# Patient Record
Sex: Female | Born: 1975 | Race: White | Hispanic: No | Marital: Married | Smoking: Former smoker
Health system: Southern US, Community
[De-identification: ages and names within clinical notes are randomized; demographics above are authoritative.]

## PROBLEM LIST (undated history)

## (undated) DIAGNOSIS — E559 Vitamin D deficiency, unspecified: Secondary | ICD-10-CM

## (undated) DIAGNOSIS — K5909 Other constipation: Secondary | ICD-10-CM

## (undated) DIAGNOSIS — R102 Pelvic and perineal pain: Secondary | ICD-10-CM

## (undated) DIAGNOSIS — K219 Gastro-esophageal reflux disease without esophagitis: Secondary | ICD-10-CM

## (undated) DIAGNOSIS — Z8742 Personal history of other diseases of the female genital tract: Secondary | ICD-10-CM

## (undated) HISTORY — PX: LAPAROSCOPIC ASSISTED VAGINAL HYSTERECTOMY: SHX5398

---

## 2004-07-30 ENCOUNTER — Other Ambulatory Visit: Admission: RE | Admit: 2004-07-30 | Discharge: 2004-07-30 | Payer: Self-pay | Admitting: Obstetrics and Gynecology

## 2004-12-30 ENCOUNTER — Inpatient Hospital Stay (HOSPITAL_COMMUNITY): Admission: AD | Admit: 2004-12-30 | Discharge: 2004-12-31 | Payer: Self-pay | Admitting: Obstetrics and Gynecology

## 2005-02-12 ENCOUNTER — Inpatient Hospital Stay (HOSPITAL_COMMUNITY): Admission: AD | Admit: 2005-02-12 | Discharge: 2005-02-14 | Payer: Self-pay | Admitting: Obstetrics and Gynecology

## 2005-08-01 ENCOUNTER — Other Ambulatory Visit: Admission: RE | Admit: 2005-08-01 | Discharge: 2005-08-01 | Payer: Self-pay | Admitting: Obstetrics and Gynecology

## 2007-10-06 HISTORY — PX: LAPAROSCOPIC TUBAL LIGATION: SUR803

## 2007-10-15 ENCOUNTER — Ambulatory Visit: Payer: Self-pay | Admitting: Obstetrics and Gynecology

## 2007-10-23 ENCOUNTER — Ambulatory Visit: Payer: Self-pay | Admitting: Obstetrics and Gynecology

## 2009-02-21 ENCOUNTER — Ambulatory Visit (HOSPITAL_COMMUNITY): Admission: RE | Admit: 2009-02-21 | Discharge: 2009-02-22 | Payer: Self-pay | Admitting: Obstetrics and Gynecology

## 2009-02-21 ENCOUNTER — Encounter (INDEPENDENT_AMBULATORY_CARE_PROVIDER_SITE_OTHER): Payer: Self-pay | Admitting: Obstetrics and Gynecology

## 2010-04-22 LAB — CBC
Hemoglobin: 10.9 g/dL — ABNORMAL LOW (ref 12.0–15.0)
MCV: 93.3 fL (ref 78.0–100.0)
Platelets: 147 10*3/uL — ABNORMAL LOW (ref 150–400)
Platelets: 191 10*3/uL (ref 150–400)
RDW: 12.8 % (ref 11.5–15.5)
RDW: 12.9 % (ref 11.5–15.5)
WBC: 5.9 10*3/uL (ref 4.0–10.5)

## 2010-04-22 LAB — PREGNANCY, URINE: Preg Test, Ur: NEGATIVE

## 2010-06-22 NOTE — Discharge Summary (Signed)
NAMEJIMMI, [name]                ACCOUNT NO.:  [id number]   MEDICAL RECORD NO.:  [id number]          PATIENT TYPE:  INP   LOCATION:  9124                          FACILITY:  [facility]   PHYSICIAN:  Zenaida Niece, M.D.DATE OF BIRTH:  1975/06/23   DATE OF ADMISSION:  02/12/2005  DATE OF DISCHARGE:                                 DISCHARGE SUMMARY   ADMISSION DIAGNOSES:  1.  Intrauterine pregnancy at 39 weeks.  2.  Group B streptococcus carrier.   DISCHARGE DIAGNOSES:  1.  Intrauterine pregnancy at 39 weeks.  2.  Group B streptococcus carrier.   PROCEDURES:  On February 12, 2005, she had a spontaneous vaginal delivery.   HISTORY AND PHYSICAL:  This is a 35 year old white female gravida 2 para 0-0-  1-0 with an EGA of [redacted] weeks who presents for elective induction. Prenatal  care essentially uncomplicated. Prenatal labs:  Blood type is A positive  with a negative antibody screen, RPR nonreactive, rubella equivocal,  hepatitis B surface antigen negative, HIV negative, gonorrhea and chlamydia  negative, triple screen normal. One-hour Glucola 162; 3-hour GTT 74, 149,  141, and 120. Group B strep is positive. Past OB history:  One elective  termination. Past GYN history:  She had a LEEP in 2000. Allergies to CODEINE  which causes nausea and vomiting. Physical exam:  She is afebrile with  stable vital signs with blood pressures 120-140 over 80-90. Fetal heart  tracing is reactive. Abdomen gravid, nontender with an estimated fetal  weight of 6 pounds. Cervix is 3, 80, -1, vertex presentation, adequate  pelvis, and amniotomy reveals clear fluid.   HOSPITAL COURSE:  The patient was admitted and started on Pitocin for  induction and penicillin for group B strep prophylaxis. On my first exam I  then also performed amniotomy for augmentation. She progressed into active  labor and received an epidural. She then progressed to complete, pushed  well, and on the afternoon of January 9 had a vaginal  delivery of a viable  female infant with Apgars of 9 and 9 that weighed 6 pounds 11 ounces. A loose  nuchal cord x2 was reduced. Placenta delivered spontaneous, was intact, and  was sent for cord blood collection. She had a small first-degree laceration  repaired with 3-0 Vicryl and estimated blood loss was less than 500 cc.  Postpartum she had no significant complications. Predelivery hemoglobin 11,  postdelivery 9.8. On postpartum day #2 she was felt to be stable enough for  discharge home.   DISCHARGE INSTRUCTIONS:  Regular diet, pelvic rest, follow-up in 6 weeks.  Medications are over-the-counter ibuprofen as needed, and she is given our  discharge pamphlet.      Zenaida Niece, M.D.  Electronically Signed    TDM/MEDQ  D:  02/14/2005  T:  02/14/2005  Job:  [id number]

## 2010-11-29 ENCOUNTER — Ambulatory Visit: Payer: Self-pay | Admitting: Family Medicine

## 2014-02-09 ENCOUNTER — Ambulatory Visit: Payer: Self-pay | Admitting: Gastroenterology

## 2014-02-14 ENCOUNTER — Ambulatory Visit: Payer: Self-pay | Admitting: Gastroenterology

## 2014-05-30 LAB — SURGICAL PATHOLOGY

## 2015-10-27 ENCOUNTER — Encounter (HOSPITAL_BASED_OUTPATIENT_CLINIC_OR_DEPARTMENT_OTHER): Payer: Self-pay | Admitting: *Deleted

## 2015-10-27 NOTE — Progress Notes (Signed)
NPO AFTER MN.  ARRIVE AT 0600.  GETTING LAB WORK DONE Monday 10-30-2015 (CBC, BMET).

## 2015-10-30 DIAGNOSIS — N736 Female pelvic peritoneal adhesions (postinfective): Secondary | ICD-10-CM | POA: Diagnosis not present

## 2015-10-30 DIAGNOSIS — E559 Vitamin D deficiency, unspecified: Secondary | ICD-10-CM | POA: Diagnosis not present

## 2015-10-30 DIAGNOSIS — K219 Gastro-esophageal reflux disease without esophagitis: Secondary | ICD-10-CM | POA: Diagnosis not present

## 2015-10-30 DIAGNOSIS — K5909 Other constipation: Secondary | ICD-10-CM | POA: Diagnosis not present

## 2015-10-30 DIAGNOSIS — R102 Pelvic and perineal pain: Secondary | ICD-10-CM | POA: Diagnosis not present

## 2015-10-30 DIAGNOSIS — Z90710 Acquired absence of both cervix and uterus: Secondary | ICD-10-CM | POA: Diagnosis not present

## 2015-10-30 DIAGNOSIS — Z87891 Personal history of nicotine dependence: Secondary | ICD-10-CM | POA: Diagnosis not present

## 2015-10-30 LAB — BASIC METABOLIC PANEL
Anion gap: 8 (ref 5–15)
BUN: 10 mg/dL (ref 6–20)
CALCIUM: 9.3 mg/dL (ref 8.9–10.3)
CO2: 29 mmol/L (ref 22–32)
Chloride: 104 mmol/L (ref 101–111)
Creatinine, Ser: 0.6 mg/dL (ref 0.44–1.00)
GFR calc Af Amer: >60 mL/min (ref >60)
GLUCOSE: 79 mg/dL (ref 65–99)
POTASSIUM: 3.6 mmol/L (ref 3.5–5.1)
Sodium: 141 mmol/L (ref 135–145)

## 2015-10-30 LAB — CBC
HCT: 37.2 % (ref 36.0–46.0)
HEMOGLOBIN: 12.7 g/dL (ref 12.0–15.0)
MCH: 30.2 pg (ref 26.0–34.0)
MCHC: 34.1 g/dL (ref 30.0–36.0)
MCV: 88.6 fL (ref 78.0–100.0)
PLATELETS: 215 10*3/uL (ref 150–400)
RBC: 4.2 MIL/uL (ref 3.87–5.11)
RDW: 12.3 % (ref 11.5–15.5)
WBC: 5.6 10*3/uL (ref 4.0–10.5)

## 2015-10-30 NOTE — H&P (Signed)
Carolyn LuzSonya Renea Bowers is an 40 y.o. female.   She hHad LAVH/BSO in 2011 for pelvic pain/endometriosis, doing well. Still has occasional pain, concerned. Has some night sweats, tolerable. Sexually active, using Replens 2-3x/week for dryness and this has helped. Had normal colonoscopy and GI eval. Getting mammogram today.  Pain is getting worse, she now desires laparoscopy.  Pertinent Gynecological History: Last mammogram: normal Date: 07/2015 OB History: G2, P1011   Menstrual History: No LMP recorded. Patient has had a hysterectomy.    Past Medical History:  Diagnosis Date  . Chronic constipation   . GERD (gastroesophageal reflux disease)   . History of endometriosis   . Pelvic pain in female   . Vitamin D deficiency     Past Surgical History:  Procedure Laterality Date  . LAPAROSCOPIC ASSISTED VAGINAL HYSTERECTOMY  02-21-2009  dr Jackelyn Knifemeisinger   w/ Bilateral Salpinoophorectomy  . LAPAROSCOPIC TUBAL LIGATION Bilateral 10/2007   w/ Ablation Endometriosis    History reviewed. No pertinent family history.  Social History:  reports that she quit smoking about 6 years ago. Her smoking use included Cigarettes. She quit after 15.00 years of use. She has never used smokeless tobacco. She reports that she does not drink alcohol or use drugs.  Allergies: No Known Allergies  No prescriptions prior to admission.    Review of Systems  Respiratory: Negative.   Cardiovascular: Negative.     Height 5\' 3"  (1.6 m), weight 60.8 kg (134 lb). Physical Exam  Constitutional: She appears well-developed and well-nourished.  Neck: Neck supple. No thyromegaly present.  Cardiovascular: Normal rate, regular rhythm and normal heart sounds.   No murmur heard. Respiratory: Effort normal and breath sounds normal. No respiratory distress. She has no wheezes.  GI: Soft. She exhibits no distension and no mass. There is no tenderness.  Genitourinary: Vagina normal.  Genitourinary Comments: No adnexal mass or  significant tenderness    Results for orders placed or performed during the hospital encounter of 10/31/15 (from the past 24 hour(s))  CBC     Status: None   Collection Time: 10/30/15 11:20 AM  Result Value Ref Range   WBC 5.6 4.0 - 10.5 K/uL   RBC 4.20 3.87 - 5.11 MIL/uL   Hemoglobin 12.7 12.0 - 15.0 g/dL   HCT 16.137.2 09.636.0 - 04.546.0 %   MCV 88.6 78.0 - 100.0 fL   MCH 30.2 26.0 - 34.0 pg   MCHC 34.1 30.0 - 36.0 g/dL   RDW 40.912.3 81.111.5 - 91.415.5 %   Platelets 215 150 - 400 K/uL  Basic metabolic panel     Status: None   Collection Time: 10/30/15 11:20 AM  Result Value Ref Range   Sodium 141 135 - 145 mmol/L   Potassium 3.6 3.5 - 5.1 mmol/L   Chloride 104 101 - 111 mmol/L   CO2 29 22 - 32 mmol/L   Glucose, Bld 79 65 - 99 mg/dL   BUN 10 6 - 20 mg/dL   Creatinine, Ser 7.820.60 0.44 - 1.00 mg/dL   Calcium 9.3 8.9 - 95.610.3 mg/dL   GFR calc non Af Amer >60 >60 mL/min   GFR calc Af Amer >60 >60 mL/min   Anion gap 8 5 - 15    No results found.  Assessment/Plan: Persistent, recurrent pelvic pain s/p LAVH 6 years ago.  Discussed all medical and surgical options.  Discussed laparoscopic procedure, risks, chances of relieving symptoms.  Will admit for diagnostic/possible operative laparoscopy.  [name] D 10/30/2015, 8:23 PM

## 2015-10-31 ENCOUNTER — Ambulatory Visit (HOSPITAL_BASED_OUTPATIENT_CLINIC_OR_DEPARTMENT_OTHER): Payer: BLUE CROSS/BLUE SHIELD | Admitting: Anesthesiology

## 2015-10-31 ENCOUNTER — Encounter (HOSPITAL_BASED_OUTPATIENT_CLINIC_OR_DEPARTMENT_OTHER)
Admission: RE | Disposition: A | Discharge status: Home / Self Care | Payer: Self-pay | Source: Ambulatory Visit | Attending: Obstetrics and Gynecology

## 2015-10-31 ENCOUNTER — Ambulatory Visit (HOSPITAL_BASED_OUTPATIENT_CLINIC_OR_DEPARTMENT_OTHER)
Admission: RE | Admit: 2015-10-31 | Discharge: 2015-10-31 | Disposition: A | Discharge status: Home / Self Care | Payer: BLUE CROSS/BLUE SHIELD | Source: Ambulatory Visit | Attending: Obstetrics and Gynecology | Admitting: Obstetrics and Gynecology

## 2015-10-31 ENCOUNTER — Encounter (HOSPITAL_BASED_OUTPATIENT_CLINIC_OR_DEPARTMENT_OTHER): Payer: Self-pay | Admitting: *Deleted

## 2015-10-31 DIAGNOSIS — K5909 Other constipation: Secondary | ICD-10-CM | POA: Insufficient documentation

## 2015-10-31 DIAGNOSIS — R102 Pelvic and perineal pain: Secondary | ICD-10-CM | POA: Diagnosis not present

## 2015-10-31 DIAGNOSIS — E559 Vitamin D deficiency, unspecified: Secondary | ICD-10-CM | POA: Insufficient documentation

## 2015-10-31 DIAGNOSIS — N736 Female pelvic peritoneal adhesions (postinfective): Secondary | ICD-10-CM | POA: Insufficient documentation

## 2015-10-31 DIAGNOSIS — K219 Gastro-esophageal reflux disease without esophagitis: Secondary | ICD-10-CM | POA: Insufficient documentation

## 2015-10-31 DIAGNOSIS — Z90710 Acquired absence of both cervix and uterus: Secondary | ICD-10-CM | POA: Insufficient documentation

## 2015-10-31 DIAGNOSIS — Z87891 Personal history of nicotine dependence: Secondary | ICD-10-CM | POA: Insufficient documentation

## 2015-10-31 HISTORY — DX: Vitamin D deficiency, unspecified: E55.9

## 2015-10-31 HISTORY — DX: Pelvic and perineal pain: R10.2

## 2015-10-31 HISTORY — PX: LAPAROSCOPY: SHX197

## 2015-10-31 HISTORY — DX: Personal history of other diseases of the female genital tract: Z87.42

## 2015-10-31 HISTORY — DX: Gastro-esophageal reflux disease without esophagitis: K21.9

## 2015-10-31 HISTORY — DX: Other constipation: K59.09

## 2015-10-31 SURGERY — LAPAROSCOPY, DIAGNOSTIC
Anesthesia: General | Site: Abdomen

## 2015-10-31 MED ORDER — PROPOFOL 10 MG/ML IV BOLUS
INTRAVENOUS | Status: AC
  Filled 2015-10-31: qty 20

## 2015-10-31 MED ORDER — HYDROCODONE-ACETAMINOPHEN 5-325 MG PO TABS: 1.0000 | ORAL_TABLET | Freq: Four times a day (QID) | ORAL | 0 refills | Status: AC | PRN

## 2015-10-31 MED ORDER — MIDAZOLAM HCL 2 MG/2ML IJ SOLN
INTRAMUSCULAR | Status: AC
  Filled 2015-10-31: qty 2

## 2015-10-31 MED ORDER — HYDROCODONE-ACETAMINOPHEN 5-325 MG PO TABS
1.0000 | ORAL_TABLET | Freq: Once | ORAL | Status: AC
  Administered 2015-10-31: 1 via ORAL
  Filled 2015-10-31: qty 1

## 2015-10-31 MED ORDER — ONDANSETRON HCL 4 MG/2ML IJ SOLN
4.0000 mg | Freq: Once | INTRAMUSCULAR | Status: DC | PRN
  Filled 2015-10-31: qty 2

## 2015-10-31 MED ORDER — PROPOFOL 10 MG/ML IV BOLUS
INTRAVENOUS | Status: DC | PRN
  Administered 2015-10-31: 200 mg via INTRAVENOUS

## 2015-10-31 MED ORDER — ONDANSETRON HCL 4 MG/2ML IJ SOLN
INTRAMUSCULAR | Status: DC | PRN
  Administered 2015-10-31: 4 mg via INTRAVENOUS

## 2015-10-31 MED ORDER — LIDOCAINE HCL (CARDIAC) 20 MG/ML IV SOLN
INTRAVENOUS | Status: DC | PRN
  Administered 2015-10-31: 100 mg via INTRAVENOUS

## 2015-10-31 MED ORDER — DEXAMETHASONE SODIUM PHOSPHATE 10 MG/ML IJ SOLN
INTRAMUSCULAR | Status: AC
  Filled 2015-10-31: qty 1

## 2015-10-31 MED ORDER — FENTANYL CITRATE (PF) 100 MCG/2ML IJ SOLN
25.0000 ug | INTRAMUSCULAR | Status: DC | PRN
  Filled 2015-10-31: qty 1

## 2015-10-31 MED ORDER — KETOROLAC TROMETHAMINE 30 MG/ML IJ SOLN
INTRAMUSCULAR | Status: AC
  Filled 2015-10-31: qty 1

## 2015-10-31 MED ORDER — FENTANYL CITRATE (PF) 100 MCG/2ML IJ SOLN
INTRAMUSCULAR | Status: AC
  Filled 2015-10-31: qty 2

## 2015-10-31 MED ORDER — SODIUM CHLORIDE 0.9 % IR SOLN
Status: DC | PRN
  Administered 2015-10-31: 1000 mL

## 2015-10-31 MED ORDER — HYDROCODONE-ACETAMINOPHEN 5-325 MG PO TABS
ORAL_TABLET | ORAL | Status: AC
  Filled 2015-10-31: qty 1

## 2015-10-31 MED ORDER — SUCCINYLCHOLINE CHLORIDE 20 MG/ML IJ SOLN
INTRAMUSCULAR | Status: DC | PRN
  Administered 2015-10-31: 100 mg via INTRAVENOUS

## 2015-10-31 MED ORDER — LIDOCAINE 2% (20 MG/ML) 5 ML SYRINGE
INTRAMUSCULAR | Status: AC
  Filled 2015-10-31: qty 5

## 2015-10-31 MED ORDER — DEXAMETHASONE SODIUM PHOSPHATE 4 MG/ML IJ SOLN
INTRAMUSCULAR | Status: DC | PRN
  Administered 2015-10-31: 10 mg via INTRAVENOUS

## 2015-10-31 MED ORDER — BUPIVACAINE HCL (PF) 0.25 % IJ SOLN
INTRAMUSCULAR | Status: DC | PRN
  Administered 2015-10-31: 20 mL

## 2015-10-31 MED ORDER — ONDANSETRON HCL 4 MG/2ML IJ SOLN
INTRAMUSCULAR | Status: AC
  Filled 2015-10-31: qty 2

## 2015-10-31 MED ORDER — FENTANYL CITRATE (PF) 100 MCG/2ML IJ SOLN
INTRAMUSCULAR | Status: DC | PRN
  Administered 2015-10-31: 25 ug via INTRAVENOUS
  Administered 2015-10-31: 50 ug via INTRAVENOUS
  Administered 2015-10-31 (×3): 25 ug via INTRAVENOUS
  Administered 2015-10-31: 50 ug via INTRAVENOUS

## 2015-10-31 MED ORDER — LACTATED RINGERS IV SOLN
INTRAVENOUS | Status: DC
  Administered 2015-10-31 (×2): via INTRAVENOUS
  Filled 2015-10-31: qty 1000

## 2015-10-31 MED ORDER — KETOROLAC TROMETHAMINE 30 MG/ML IJ SOLN
INTRAMUSCULAR | Status: DC | PRN
  Administered 2015-10-31: 30 mg via INTRAVENOUS

## 2015-10-31 MED ORDER — MIDAZOLAM HCL 5 MG/5ML IJ SOLN
INTRAMUSCULAR | Status: DC | PRN
  Administered 2015-10-31: 2 mg via INTRAVENOUS

## 2015-10-31 SURGICAL SUPPLY — 54 items
APL SKNCLS STERI-STRIP NONHPOA (GAUZE/BANDAGES/DRESSINGS) ×1
APPLICATOR COTTON TIP 6IN STRL (MISCELLANEOUS) ×3 IMPLANT
BAG SPEC RTRVL LRG 6X4 10 (ENDOMECHANICALS)
BENZOIN TINCTURE PRP APPL 2/3 (GAUZE/BANDAGES/DRESSINGS) ×3 IMPLANT
BLADE SURG 15 STRL LF DISP TIS (BLADE) ×1 IMPLANT
BLADE SURG 15 STRL SS (BLADE) ×3
CANISTER SUCTION 1200CC (MISCELLANEOUS) IMPLANT
CANISTER SUCTION 2500CC (MISCELLANEOUS) ×2 IMPLANT
CATH ROBINSON RED A/P 16FR (CATHETERS) ×2 IMPLANT
CLOSURE WOUND 1/4X4 (GAUZE/BANDAGES/DRESSINGS)
DECANTER SPIKE VIAL GLASS SM (MISCELLANEOUS) IMPLANT
DRAPE UNDERBUTTOCKS STRL (DRAPE) ×3 IMPLANT
ELECT REM PT RETURN 9FT ADLT (ELECTROSURGICAL) ×3
ELECTRODE REM PT RTRN 9FT ADLT (ELECTROSURGICAL) ×1 IMPLANT
GLOVE BIO SURGEON STRL SZ7 (GLOVE) ×2 IMPLANT
GLOVE BIO SURGEON STRL SZ8 (GLOVE) ×3 IMPLANT
GLOVE ECLIPSE 7.0 STRL STRAW (GLOVE) ×2 IMPLANT
GLOVE INDICATOR 7.5 STRL GRN (GLOVE) ×4 IMPLANT
GLOVE ORTHO TXT STRL SZ7.5 (GLOVE) ×3 IMPLANT
GOWN STRL REUS W/TWL LRG LVL3 (GOWN DISPOSABLE) ×2 IMPLANT
GOWN STRL REUS W/TWL XL LVL3 (GOWN DISPOSABLE) ×5 IMPLANT
KIT ROOM TURNOVER WOR (KITS) ×3 IMPLANT
LIQUID BAND (GAUZE/BANDAGES/DRESSINGS) ×2 IMPLANT
NDL HYPO 25X1 1.5 SAFETY (NEEDLE) IMPLANT
NDL INSUFFLATION 14GA 120MM (NEEDLE) ×1 IMPLANT
NDL INSUFFLATION 14GA 150MM (NEEDLE) IMPLANT
NEEDLE HYPO 25X1 1.5 SAFETY (NEEDLE) ×3 IMPLANT
NEEDLE INSUFFLATION 120MM (ENDOMECHANICALS) ×3 IMPLANT
NEEDLE INSUFFLATION 14GA 120MM (NEEDLE) ×3 IMPLANT
NEEDLE INSUFFLATION 14GA 150MM (NEEDLE) IMPLANT
PACK BASIN DAY SURGERY FS (CUSTOM PROCEDURE TRAY) ×3 IMPLANT
PACK LAPAROSCOPY II (CUSTOM PROCEDURE TRAY) ×3 IMPLANT
PAD OB MATERNITY 4.3X12.25 (PERSONAL CARE ITEMS) ×3 IMPLANT
PAD PREP 24X48 CUFFED NSTRL (MISCELLANEOUS) ×3 IMPLANT
PADDING ION DISPOSABLE (MISCELLANEOUS) ×3 IMPLANT
POUCH SPECIMEN RETRIEVAL 10MM (ENDOMECHANICALS) IMPLANT
SCISSORS LAP 5X35 DISP (ENDOMECHANICALS) IMPLANT
SOLUTION ANTI FOG 6CC (MISCELLANEOUS) ×3 IMPLANT
STRIP CLOSURE SKIN 1/4X4 (GAUZE/BANDAGES/DRESSINGS) IMPLANT
SUT VICRYL 0 UR6 27IN ABS (SUTURE) ×4 IMPLANT
SUT VICRYL 4-0 PS2 18IN ABS (SUTURE) ×5 IMPLANT
SYR 3ML 23GX1 SAFETY (SYRINGE) ×2 IMPLANT
SYR CONTROL 10ML LL (SYRINGE) ×2 IMPLANT
SYRINGE 10CC LL (SYRINGE) ×3 IMPLANT
TOWEL OR 17X24 6PK STRL BLUE (TOWEL DISPOSABLE) ×6 IMPLANT
TRAY DSU PREP LF (CUSTOM PROCEDURE TRAY) ×3 IMPLANT
TRAY FOLEY CATH SILVER 14FR (SET/KITS/TRAYS/PACK) IMPLANT
TROCAR XCEL BLUNT TIP 100MML (ENDOMECHANICALS) IMPLANT
TROCAR XCEL NON-BLD 11X100MML (ENDOMECHANICALS) IMPLANT
TROCAR XCEL NON-BLD 5MMX100MML (ENDOMECHANICALS) ×3 IMPLANT
TROCAR Z-THREAD FIOS 5X100MM (TROCAR) ×2 IMPLANT
TUBING INSUFFLATION 10FT LAP (TUBING) ×3 IMPLANT
WARMER LAPAROSCOPE (MISCELLANEOUS) ×3 IMPLANT
WATER STERILE IRR 500ML POUR (IV SOLUTION) ×1 IMPLANT

## 2015-10-31 NOTE — Anesthesia Procedure Notes (Signed)
Procedure Name: Intubation Date/Time: 10/31/2015 7:29 AM Performed by: Jessica PriestBEESON, Carolyn Sherwin C Pre-anesthesia Checklist: Patient identified, Emergency Drugs available, Suction available and Patient being monitored Patient Re-evaluated:Patient Re-evaluated prior to inductionOxygen Delivery Method: Circle system utilized Preoxygenation: Pre-oxygenation with 100% oxygen Intubation Type: IV induction Ventilation: Mask ventilation without difficulty Laryngoscope Size: Mac and 3 Grade View: Grade II Tube type: Oral Tube size: 7.0 mm Number of attempts: 1 Airway Equipment and Method: Stylet and Oral airway Placement Confirmation: ETT inserted through vocal cords under direct vision,  positive ETCO2 and breath sounds checked- equal and bilateral Secured at: 21 cm Tube secured with: Tape Dental Injury: Teeth and Oropharynx as per pre-operative assessment

## 2015-10-31 NOTE — Discharge Instructions (Signed)
HOME CARE INSTRUCTIONS - LAPAROSCOPY ° °Wound Care: The bandaids or dressing which are placed over the skin openings may be removed the day after surgery. The incision should be kept clean and dry. The stitches do not need to be removed. Should the incision become sore, red, and swollen after the first week, check with your doctor. ° °Personal Hygiene: Shower the day after your procedure. Always wipe from front to back after elimination.  ° °Activity: Do not drive or operate any equipment today. The effects of the anesthesia are still present and drowsiness may result. Rest today, not necessarily flat bed rest, just take it easy. You may resume your normal activity in one to three days or as instructed by your physician. ° °Sexual Activity: You resume sexual activity as indicated by your physician_________. °If your laparoscopy was for a sterilization ( tubes tied ), continue current method of birth control until after your next period or ask for specific instructions from your doctor. ° °Diet: Eat a light diet as desired this evening. You may resume a regular diet tomorrow. ° °Return to Work: Two to three days or as indicated by your doctor. ° °Expectations °After Surgery: Your surgery will cause vaginal drainage or spotting which may continue for 2-3 days. Mild abdominal discomfort or tenderness is not unusual and some shoulder pain may also be noted which can be relieved by lying flat in pain. ° °Call Your Doctor °If these Occur:  Persistent or heavy bleeding at incision site °      Redness or swelling around incision °      Elevation of temperature greater than 100 degrees F ° °Call for follow-up appointment _____________. ° ° °Post Anesthesia Home Care Instructions ° °Activity: °Get plenty of rest for the remainder of the day. A responsible adult should stay with you for 24 hours following the procedure.  °For the next 24 hours, DO NOT: °-Drive a car °-Operate machinery °-Drink alcoholic beverages °-Take any  medication unless instructed by your physician °-Make any legal decisions or sign important papers. ° °Meals: °Start with liquid foods such as gelatin or soup. Progress to regular foods as tolerated. Avoid greasy, spicy, heavy foods. If nausea and/or vomiting occur, drink only clear liquids until the nausea and/or vomiting subsides. Call your physician if vomiting continues. ° °Special Instructions/Symptoms: °Your throat may feel dry or sore from the anesthesia or the breathing tube placed in your throat during surgery. If this causes discomfort, gargle with warm salt water. The discomfort should disappear within 24 hours. ° °If you had a scopolamine patch placed behind your ear for the management of post- operative nausea and/or vomiting: ° °1. The medication in the patch is effective for 72 hours, after which it should be removed.  Wrap patch in a tissue and discard in the trash. Wash hands thoroughly with soap and water. °2. You may remove the patch earlier than 72 hours if you experience unpleasant side effects which may include dry mouth, dizziness or visual disturbances. °3. Avoid touching the patch. Wash your hands with soap and water after contact with the patch. °  °Routine instructions for laparoscopy °

## 2015-10-31 NOTE — Anesthesia Postprocedure Evaluation (Signed)
Anesthesia Post Note  Patient: [name]  Procedure(s) Performed: Procedure(s) (LRB): LAPAROSCOPY DIAGNOSTIC (N/A) LAPAROSCOPY OPERATIVE (N/A)  Patient location during evaluation: PACU Anesthesia Type: General Level of consciousness: awake and alert Pain management: pain level controlled Vital Signs Assessment: post-procedure vital signs reviewed and stable Respiratory status: spontaneous breathing, nonlabored ventilation, respiratory function stable and patient connected to nasal cannula oxygen Cardiovascular status: blood pressure returned to baseline and stable Postop Assessment: no signs of nausea or vomiting Anesthetic complications: no    Last Vitals:  Vitals:   10/31/15 0830 10/31/15 0845  BP: 119/64 119/68  Pulse: 95 73  Resp: (!) 0 (!) 0  Temp:      Last Pain:  Vitals:   10/31/15 0826  TempSrc:   PainSc: 0-No pain                 Reino KentJudd, Charlesa Ehle J

## 2015-10-31 NOTE — Transfer of Care (Signed)
Immediate Anesthesia Transfer of Care Note  Patient: [name]  Procedure(s) Performed: Procedure(s) (LRB): LAPAROSCOPY DIAGNOSTIC (N/A) LAPAROSCOPY OPERATIVE (N/A)  Patient Location: PACU  Anesthesia Type: General  Level of Consciousness: awake, sedated, patient cooperative and responds to stimulation  Airway & Oxygen Therapy: Patient Spontanous Breathing and Patient connected to face mask oxygen  Post-op Assessment: Report given to PACU RN, Post -op Vital signs reviewed and stable and Patient moving all extremities  Post vital signs: Reviewed and stable  Complications: No apparent anesthesia complications

## 2015-10-31 NOTE — Interval H&P Note (Signed)
History and Physical Interval Note:  10/31/2015 7:06 AM  [name]  has presented today for surgery, with the diagnosis of Pelvic Pain  The various methods of treatment have been discussed with the patient and family. After consideration of risks, benefits and other options for treatment, the patient has consented to  Procedure(s): LAPAROSCOPY DIAGNOSTIC (N/A) LAPAROSCOPY OPERATIVE (N/A) as a surgical intervention .  The patient's history has been reviewed, patient examined, no change in status, stable for surgery.  I have reviewed the patient's chart and labs.  Questions were answered to the patient's satisfaction.     Arlyn Bumpus D

## 2015-10-31 NOTE — Anesthesia Preprocedure Evaluation (Addendum)
Anesthesia Evaluation  Patient identified by MRN, date of birth, ID band Patient awake    Reviewed: Allergy & Precautions, H&P , NPO status , Patient's Chart, lab work & pertinent test results  History of Anesthesia Complications Negative for: history of anesthetic complications  Airway Mallampati: II  TM Distance: >3 FB Neck ROM: full    Dental no notable dental hx.    Pulmonary former smoker,    Pulmonary exam normal breath sounds clear to auscultation       Cardiovascular negative cardio ROS Normal cardiovascular exam Rhythm:regular Rate:Normal     Neuro/Psych negative neurological ROS     GI/Hepatic Neg liver ROS, GERD  ,  Endo/Other  negative endocrine ROS  Renal/GU negative Renal ROS     Musculoskeletal   Abdominal   Peds  Hematology negative hematology ROS (+)   Anesthesia Other Findings   Reproductive/Obstetrics negative OB ROS                             Anesthesia Physical Anesthesia Plan  ASA: II  Anesthesia Plan: General   Post-op Pain Management:    Induction: Intravenous  Airway Management Planned: Oral ETT  Additional Equipment:   Intra-op Plan:   Post-operative Plan:   Informed Consent: I have reviewed the patients History and Physical, chart, labs and discussed the procedure including the risks, benefits and alternatives for the proposed anesthesia with the patient or authorized representative who has indicated his/her understanding and acceptance.   Dental Advisory Given  Plan Discussed with: Anesthesiologist, CRNA and Surgeon  Anesthesia Plan Comments:         Anesthesia Quick Evaluation

## 2015-10-31 NOTE — Op Note (Signed)
Preoperative diagnosis: Pelvic pain  Postoperative diagnosis: Same  Procedure: Diagnostic laparoscopy with adhesiolysis Surgeon: Lavina Hammanodd Lindzy Rupert M.D.  Anesthesia: Gen. Endotracheal tube  Findings: She had a normal abdomen, absent uterus, tubes and ovaries, small adhesion of small intestine to vaginal cuff-otherwise normal pelvis  Specimens: None  Estimated blood loss: Minimal  Complications: None  Procedure in detail:  The patient was taken to the operating room and placed in the dorsosupine position. General anesthesia was induced. Her legs were placed in mobile stirrups and her left arm was tucked to her side. Abdomen perineum and vagina were then prepped and draped in the usual sterile fashion, bladder drained with a red Robinson catheter, a sponge stick placed into the vagina to manipulate the cuff. Infraumbilical skin was then infiltrated with quarter percent Marcaine and a 1 cm vertical incision was made. The veress needle was inserted into the peritoneal cavity and placement confirmed by the water drop test and an opening pressure of 6 mm of mercury. CO2 was insufflated to a pressure of 14 mm of mercury and the veress needle was removed. A 5mm disposable trocar was then introduced with direct visualization with the laparoscope. A 5 mm port was then placed on the left side also under direct visualization. Careful and thorough inspection revealed the above-mentioned findings.  The small adhesion was taken down with cold scissors with good visualization to avoid injury to the bowel.  No other source for pain was identified.  Silk suture posterior cuff placed during hysterectomy was visible in proper location.   The 5 mm port was removed under direct visualization. All gas was allowed to deflate from the abdomen and the umbilical trocar was removed. Skin incisions were then closed with interrupted subcuticular sutures of 4-0 Vicryl followed by Dermabond. The sponge stick was removed. The patient was  taken down from stirrups. She was awakened in the operating room and taken to the recovery room in stable condition after tolerating the procedure well. Counts were correct and she had PAS hose on throughout the procedure.

## 2015-11-01 ENCOUNTER — Encounter (HOSPITAL_BASED_OUTPATIENT_CLINIC_OR_DEPARTMENT_OTHER): Payer: Self-pay | Admitting: Obstetrics and Gynecology

## 2016-05-01 ENCOUNTER — Other Ambulatory Visit: Payer: Self-pay | Admitting: Family Medicine

## 2016-05-01 DIAGNOSIS — E049 Nontoxic goiter, unspecified: Secondary | ICD-10-CM

## 2016-05-07 ENCOUNTER — Ambulatory Visit
Admission: RE | Admit: 2016-05-07 | Discharge: 2016-05-07 | Disposition: A | Discharge status: Home / Self Care | Payer: BLUE CROSS/BLUE SHIELD | Source: Ambulatory Visit | Attending: Family Medicine | Admitting: Family Medicine

## 2016-05-07 DIAGNOSIS — E049 Nontoxic goiter, unspecified: Secondary | ICD-10-CM | POA: Insufficient documentation

## 2016-07-05 ENCOUNTER — Other Ambulatory Visit: Payer: Self-pay | Admitting: Family Medicine

## 2016-07-05 DIAGNOSIS — Z1231 Encounter for screening mammogram for malignant neoplasm of breast: Secondary | ICD-10-CM

## 2016-07-24 ENCOUNTER — Ambulatory Visit
Admission: RE | Admit: 2016-07-24 | Discharge: 2016-07-24 | Disposition: A | Discharge status: Home / Self Care | Payer: BLUE CROSS/BLUE SHIELD | Source: Ambulatory Visit | Attending: Family Medicine | Admitting: Family Medicine

## 2016-07-24 DIAGNOSIS — Z1231 Encounter for screening mammogram for malignant neoplasm of breast: Secondary | ICD-10-CM | POA: Insufficient documentation

## 2016-07-26 ENCOUNTER — Inpatient Hospital Stay
Admission: RE | Admit: 2016-07-26 | Discharge: 2016-07-26 | Disposition: A | Discharge status: Home / Self Care | Payer: Self-pay | Source: Ambulatory Visit | Attending: *Deleted | Admitting: *Deleted

## 2016-07-26 ENCOUNTER — Other Ambulatory Visit: Payer: Self-pay | Admitting: *Deleted

## 2016-07-26 DIAGNOSIS — Z9289 Personal history of other medical treatment: Secondary | ICD-10-CM

## 2016-08-06 ENCOUNTER — Inpatient Hospital Stay
Admission: RE | Admit: 2016-08-06 | Discharge: 2016-08-06 | Disposition: A | Discharge status: Home / Self Care | Payer: Self-pay | Source: Ambulatory Visit | Attending: *Deleted | Admitting: *Deleted

## 2016-08-06 ENCOUNTER — Other Ambulatory Visit: Payer: Self-pay | Admitting: *Deleted

## 2016-08-06 DIAGNOSIS — Z9289 Personal history of other medical treatment: Secondary | ICD-10-CM

## 2016-08-13 ENCOUNTER — Other Ambulatory Visit: Payer: Self-pay | Admitting: Family Medicine

## 2016-08-13 DIAGNOSIS — R928 Other abnormal and inconclusive findings on diagnostic imaging of breast: Secondary | ICD-10-CM

## 2016-09-02 ENCOUNTER — Ambulatory Visit
Admission: RE | Admit: 2016-09-02 | Discharge: 2016-09-02 | Disposition: A | Discharge status: Home / Self Care | Payer: BLUE CROSS/BLUE SHIELD | Source: Ambulatory Visit | Attending: Family Medicine | Admitting: Family Medicine

## 2016-09-02 DIAGNOSIS — R928 Other abnormal and inconclusive findings on diagnostic imaging of breast: Secondary | ICD-10-CM | POA: Diagnosis not present

## 2018-10-23 IMAGING — MG MM DIGITAL SCREENING BILAT W/ TOMO W/ CAD
8 of 14 series · 8 of 30 positions shown · non-contrast
Comparison: Previous exam(s).

CLINICAL DATA: Screening.

EXAM:
2D DIGITAL SCREENING BILATERAL MAMMOGRAM WITH CAD AND ADJUNCT TOMO

[R MLO (1 of 2)]
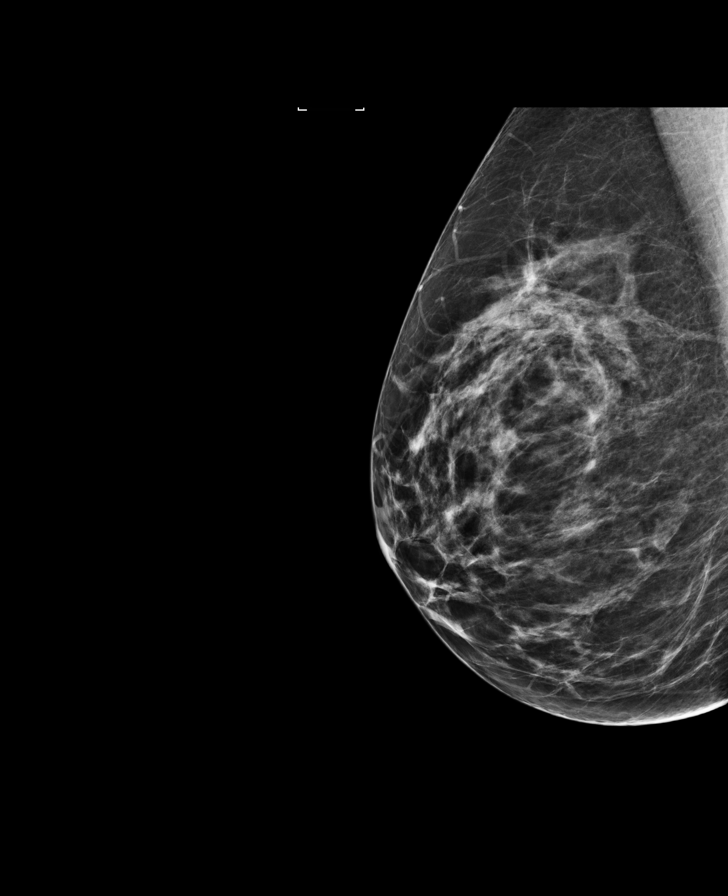

[R XCCL]
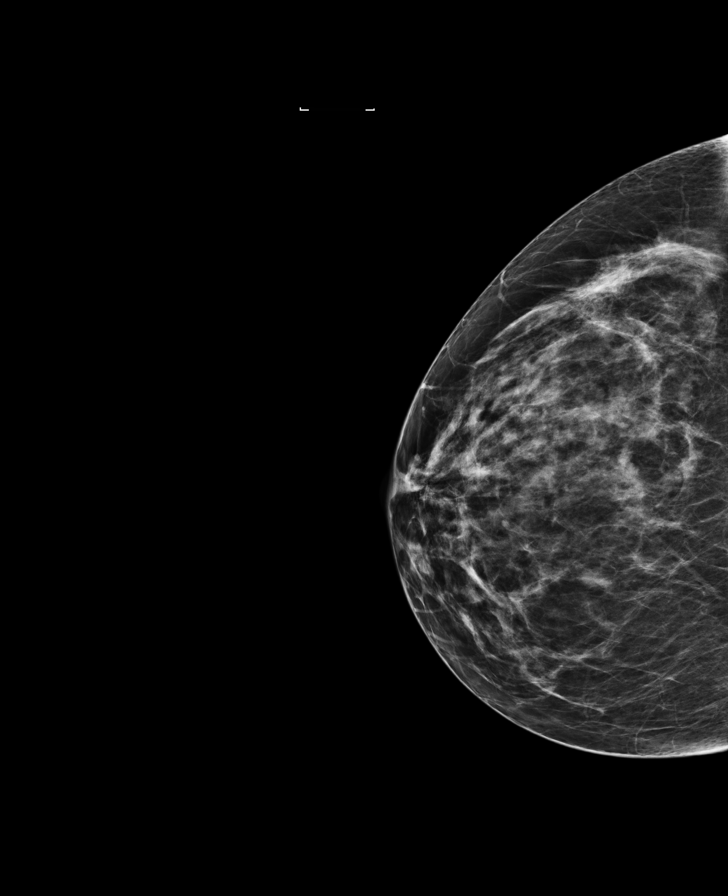

[R CC synth-2D]
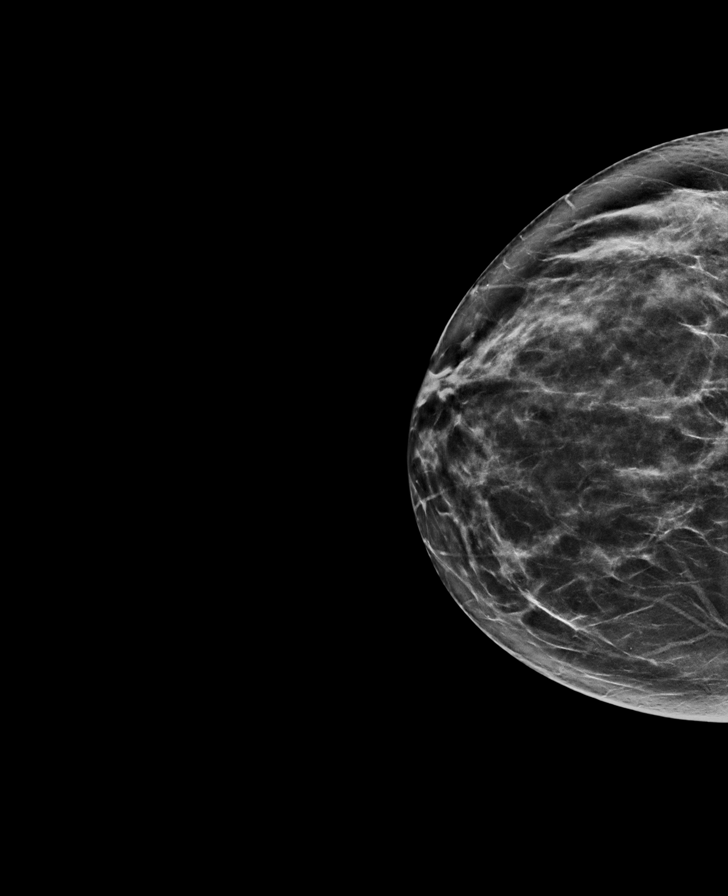

[R MLO (2 of 2)]
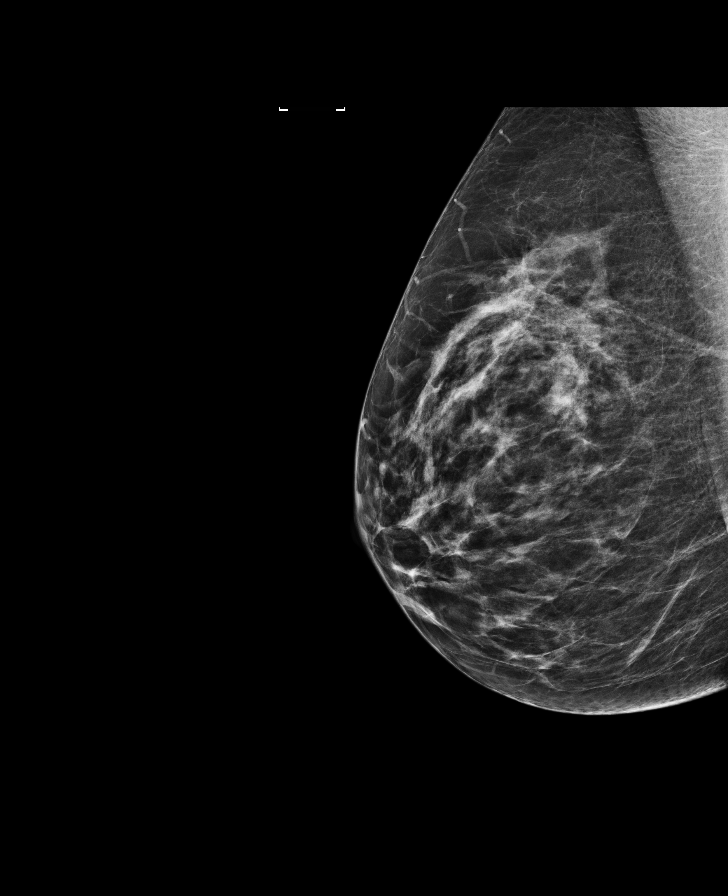

[L CC]
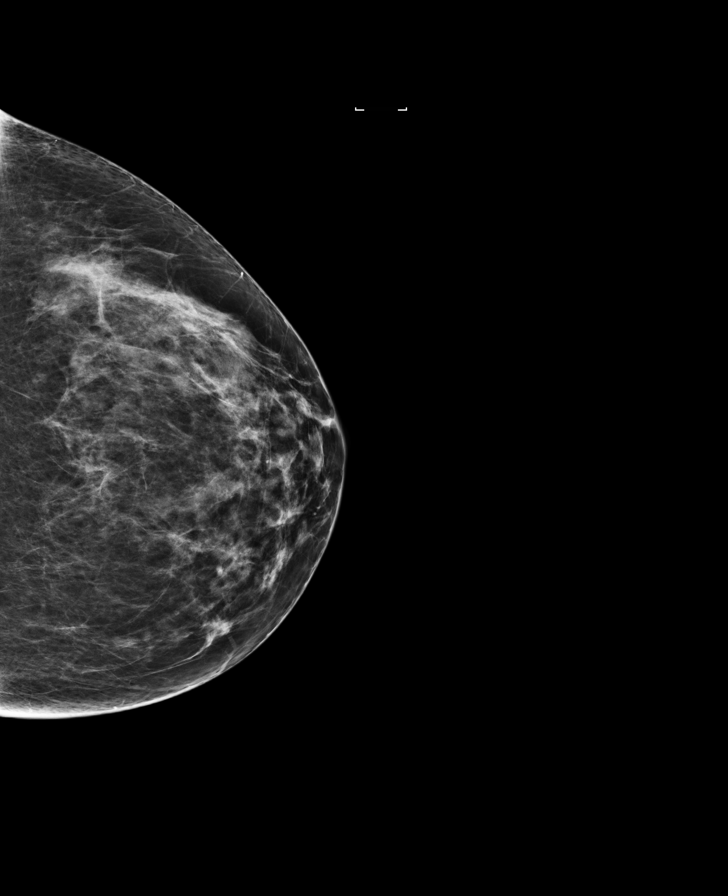

[L MLO]
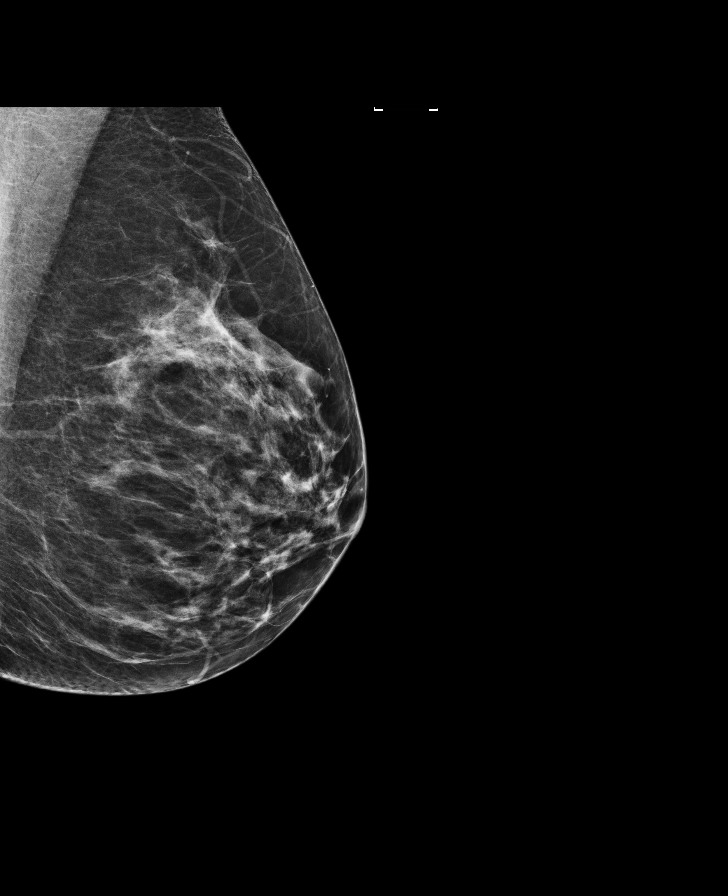

[R CC]
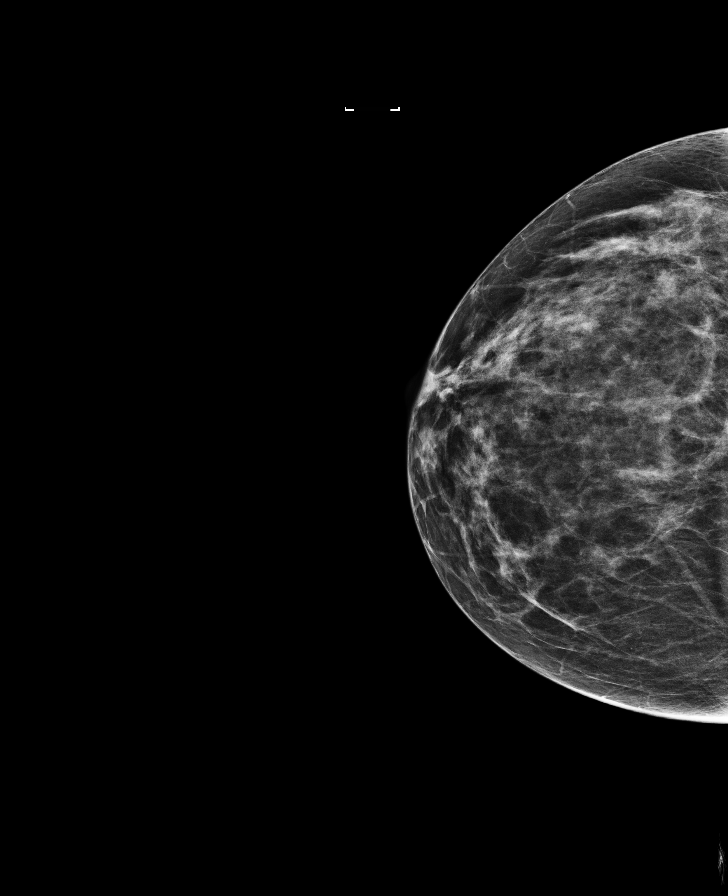

[L CC synth-2D]
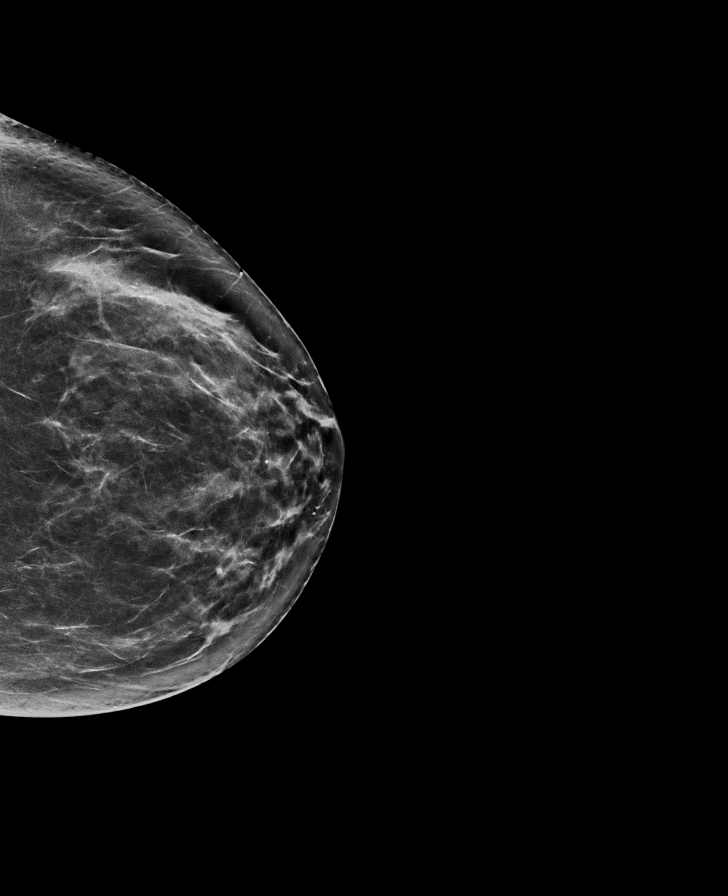

[8 of 30 positions shown; findings below may reference images not displayed]

ACR Breast Density Category b: There are scattered areas of
fibroglandular density.
FINDINGS: In the left breast, possible distortion warrants further evaluation.
In the right breast, no findings suspicious for malignancy. Images
were processed with CAD.
IMPRESSION: Further evaluation is suggested for possible distortion in the left
breast.

RECOMMENDATION:
Diagnostic mammogram and possibly ultrasound of the left breast.
(Code:FU-H-RR3)

The patient will be contacted regarding the findings, and additional
imaging will be scheduled.

BI-RADS CATEGORY  0: Incomplete. Need additional imaging evaluation
and/or prior mammograms for comparison.
